# Patient Record
Sex: Male | Born: 1970 | Race: Black or African American | Hispanic: No | Marital: Married | Smoking: Never smoker
Health system: Southern US, Community
[De-identification: ages and names within clinical notes are randomized; demographics above are authoritative.]

## PROBLEM LIST (undated history)

## (undated) DIAGNOSIS — E785 Hyperlipidemia, unspecified: Secondary | ICD-10-CM

## (undated) DIAGNOSIS — I10 Essential (primary) hypertension: Secondary | ICD-10-CM

---

## 2008-03-12 ENCOUNTER — Emergency Department (HOSPITAL_COMMUNITY): Admission: EM | Admit: 2008-03-12 | Discharge: 2008-03-12 | Payer: Self-pay | Admitting: Emergency Medicine

## 2012-06-07 ENCOUNTER — Emergency Department (INDEPENDENT_AMBULATORY_CARE_PROVIDER_SITE_OTHER): Payer: PRIVATE HEALTH INSURANCE

## 2012-06-07 ENCOUNTER — Emergency Department
Admission: EM | Admit: 2012-06-07 | Discharge: 2012-06-07 | Disposition: A | Discharge status: Home / Self Care | Payer: PRIVATE HEALTH INSURANCE | Source: Home / Self Care | Attending: Family Medicine | Admitting: Family Medicine

## 2012-06-07 DIAGNOSIS — J069 Acute upper respiratory infection, unspecified: Secondary | ICD-10-CM

## 2012-06-07 DIAGNOSIS — J40 Bronchitis, not specified as acute or chronic: Secondary | ICD-10-CM

## 2012-06-07 DIAGNOSIS — R05 Cough: Secondary | ICD-10-CM

## 2012-06-07 MED ORDER — AZITHROMYCIN 250 MG PO TABS: ORAL_TABLET | ORAL | Status: DC

## 2012-06-07 MED ORDER — HYDROCOD POLST-CHLORPHEN POLST 10-8 MG/5ML PO LQCR: 5.0000 mL | Freq: Two times a day (BID) | ORAL | Status: DC | PRN

## 2012-06-07 NOTE — ED Notes (Signed)
States sinus pressure and cough started three days ago.  W/out relief from OTC meds

## 2012-06-07 NOTE — ED Provider Notes (Signed)
History     CSN: [id number]  Arrival date & time 06/07/12  1327   First MD Initiated Contact with Patient 06/07/12 1333      Chief Complaint  Patient presents with  . Nasal Congestion  . Facial Pain   HPI  URI Symptoms Onset: 2 weeks Description: cough, rhinorrhea, sinus pressure  Modifying factors:  Hx/o asthma. Has has no wheezing or shortness of breath. No wheezing.    Symptoms Nasal discharge: yes Fever: no Sore throat: mild Cough: yes Wheezing: no Ear pain: no GI symptoms: no Sick contacts: yes  Red Flags  Stiff neck: no Dyspnea: no Rash: no Swallowing difficulty: no  Sinusitis Risk Factors Headache/face pain: no Double sickening: no tooth pain: no  Allergy Risk Factors Sneezing: no Itchy scratchy throat: no Seasonal symptoms: no  Flu Risk Factors Headache: no muscle aches: no severe fatigue: no   No past medical history on file.  No past surgical history on file.  No family history on file.  History  Substance Use Topics  . Smoking status: Not on file  . Smokeless tobacco: Not on file  . Alcohol Use: Not on file      Review of Systems  All other systems reviewed and are negative.    Allergies  Review of patient's allergies indicates no known allergies.  Home Medications   Current Outpatient Rx  Name  Route  Sig  Dispense  Refill  . AZITHROMYCIN 250 MG PO TABS      Take 2 tabs PO x 1 dose, then 1 tab PO QD x 4 days   6 tablet   0   . HYDROCOD POLST-CPM POLST ER 10-8 MG/5ML PO LQCR   Oral   Take 5 mLs by mouth every 12 (twelve) hours as needed (cough).   60 mL   0     BP 123/83  Pulse 98  Temp 98.7 F (37.1 C)  Resp 18  Ht 5\' 10"  (1.778 m)  Wt 225 lb (102.059 kg)  BMI 32.28 kg/m2  SpO2 95%  Physical Exam  Constitutional: He appears well-developed and well-nourished.  HENT:  Head: Normocephalic and atraumatic.  Right Ear: External ear normal.  Left Ear: External ear normal.       +nasal erythema,  rhinorrhea bilaterally, + post oropharyngeal erythema    Eyes: Conjunctivae normal are normal. Pupils are equal, round, and reactive to light.  Neck: Normal range of motion. Neck supple.  Cardiovascular: Normal rate, regular rhythm and normal heart sounds.   Pulmonary/Chest: Effort normal and breath sounds normal. He has no wheezes.  Abdominal: Soft.  Musculoskeletal: Normal range of motion.  Neurological: He is alert.  Skin: Skin is warm.    ED Course  Procedures (including critical care time)  Labs Reviewed - No data to display Dg Chest 2 View  06/07/2012  *RADIOLOGY REPORT*  Clinical Data: Cough  CHEST - 2 VIEW  Comparison: None.  Findings: Normal heart size and vascularity.  Slight peribronchial cuffing centrally and interstitial prominence can be seen with bronchitis.  No definite focal pneumonia, collapse, consolidation, edema, effusion or pneumothorax.  Trachea is midline.  No osseous abnormality.  IMPRESSION: Bronchitic changes as described.  No focal pneumonia.   Original Report Authenticated By: Judie Petit. Shick, M.D.      1. URI (upper respiratory infection)   2. Bronchitis       MDM  Will place on zpak for bronchitic coverage given duration of sxs.  Tussionex for cough.  Discussed resp  and infectious red flags.  Follow up as needed.     The patient and/or caregiver has been counseled thoroughly with regard to treatment plan and/or medications prescribed including dosage, schedule, interactions, rationale for use, and possible side effects and they verbalize understanding. Diagnoses and expected course of recovery discussed and will return if not improved as expected or if the condition worsens. Patient and/or caregiver verbalized understanding.             Doree Albee, MD 06/07/12 1451

## 2012-06-15 ENCOUNTER — Telehealth: Payer: Self-pay | Admitting: *Deleted

## 2013-06-22 IMAGING — CR DG CHEST 2V
2 series · 2 of 2 positions shown · non-contrast
Comparison: None.

CLINICAL DATA: Cough

CHEST - 2 VIEW

[view not recorded (1 of 2)]
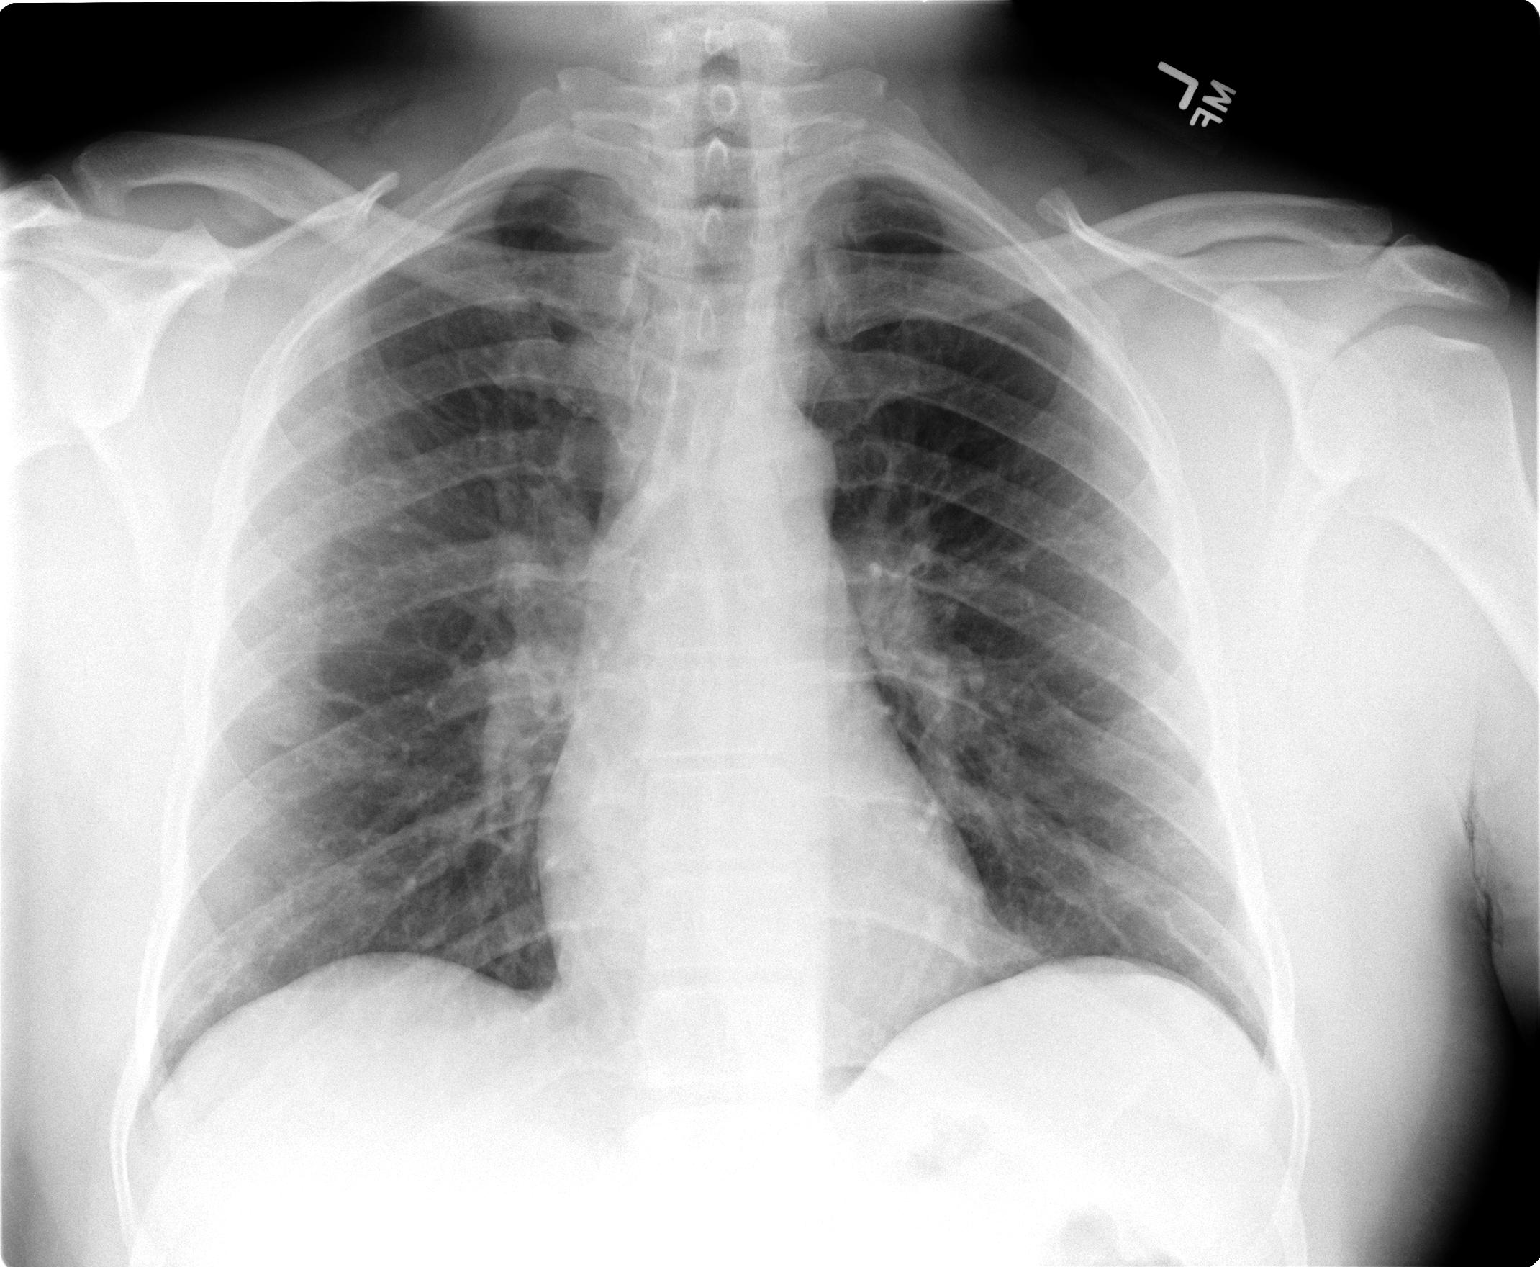

[view not recorded (2 of 2)]
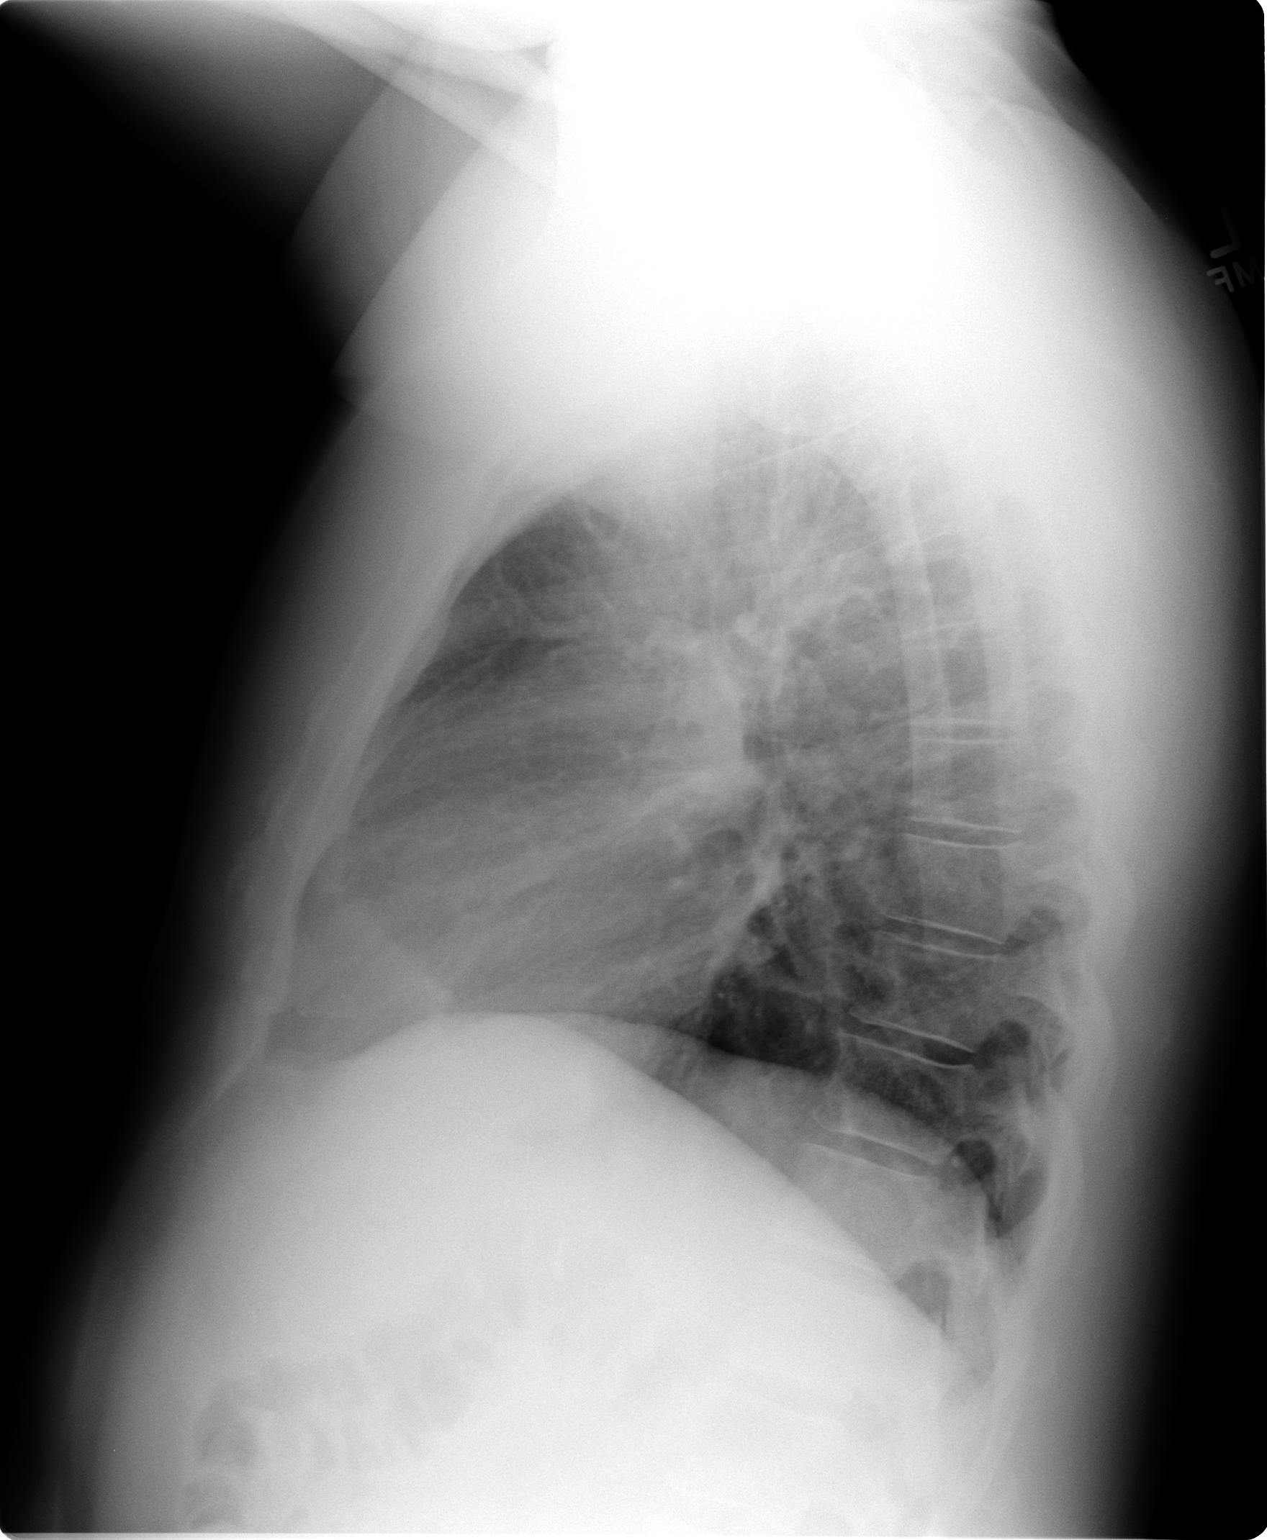

[2 of 2 positions shown; findings below may reference images not displayed]

FINDINGS: Normal heart size and vascularity.  Slight peribronchial
cuffing centrally and interstitial prominence can be seen with
bronchitis.  No definite focal pneumonia, collapse, consolidation,
edema, effusion or pneumothorax.  Trachea is midline.  No osseous
abnormality.
IMPRESSION: Bronchitic changes as described.  No focal pneumonia.

## 2013-09-24 ENCOUNTER — Emergency Department
Admission: EM | Admit: 2013-09-24 | Discharge: 2013-09-24 | Disposition: A | Discharge status: Home / Self Care | Payer: Managed Care, Other (non HMO) | Source: Home / Self Care | Attending: Family Medicine | Admitting: Family Medicine

## 2013-09-24 ENCOUNTER — Encounter: Payer: Self-pay | Admitting: Emergency Medicine

## 2013-09-24 DIAGNOSIS — J069 Acute upper respiratory infection, unspecified: Secondary | ICD-10-CM

## 2013-09-24 MED ORDER — AZITHROMYCIN 250 MG PO TABS: ORAL_TABLET | ORAL | Status: DC

## 2013-09-24 MED ORDER — PREDNISONE 20 MG PO TABS: 20.0000 mg | ORAL_TABLET | Freq: Two times a day (BID) | ORAL | Status: AC

## 2013-09-24 NOTE — Discharge Instructions (Signed)
Take plain Mucinex (1200 mg guaifenesin) twice daily for cough and congestion.  May add Sudafed for sinus congestion.   Increase fluid intake, rest. May use Afrin nasal spray (or generic oxymetazoline) twice daily for about 5 days.  Also recommend using saline nasal spray several times daily and saline nasal irrigation (AYR is a common brand) Stop all antihistamines for now, and other non-prescription cough/cold preparations. May take Delsym Cough Suppressant at bedtime for nighttime cough.  Begin Azithromycin if not improving about 5 days or if persistent fever develops   Follow-up with family doctor if not improving 7 to 10 days.

## 2013-09-24 NOTE — ED Provider Notes (Signed)
CSN: [id number]     Arrival date & time 09/24/13  1628 History   First MD Initiated Contact with Patient 09/24/13 1653     Chief Complaint  Patient presents with  . Sinus Problem      HPI Comments: Patient developed increased nasal congestion about nine days ago followed by a mild sore throat and fatigue.  He next developed a mild cough.  No fevers, chills, and sweats.  His cough has almost resolved but he has persistent sinus congestion. He has a history of seasonal allergies.  The history is provided by the patient.    History reviewed. No pertinent past medical history. History reviewed. No pertinent past surgical history. Family History  Problem Relation Age of Onset  . Hypertension Mother   . Diabetes Father   . Heart disease Father   . Hypertension Sister    History  Substance Use Topics  . Smoking status: Never Smoker   . Smokeless tobacco: Never Used  . Alcohol Use: No    Review of Systems + sore throat + cough No pleuritic pain No wheezing + nasal congestion + post-nasal drainage No sinus pain/pressure No itchy/red eyes ? earache No hemoptysis No SOB No fever, + chills No nausea No vomiting No abdominal pain No diarrhea No urinary symptoms No skin rash + fatigue No myalgias + headache Used OTC meds without relief  Allergies  Review of patient's allergies indicates no known allergies.  Home Medications   Prior to Admission medications   Not on File   BP 137/98  Pulse 93  Temp(Src) 98 F (36.7 C) (Oral)  Resp 14  Wt 225 lb (102.059 kg)  SpO2 97% Physical Exam + sore throat + cough No pleuritic pain No wheezing + nasal congestion + post-nasal drainage + sinus pain/pressure No itchy/red eyes No earache No hemoptysis No SOB No fever/chills No nausea No vomiting No abdominal pain No diarrhea No urinary symptoms No skin rash + fatigue No myalgias + headache Used OTC meds without relief   ED Course  Procedures  none       MDM   1. Viral URI     There is no evidence of bacterial infection today.   Begin prednisone burst. Take plain Mucinex (1200 mg guaifenesin) twice daily for cough and congestion.  May add Sudafed for sinus congestion.   Increase fluid intake, rest. May use Afrin nasal spray (or generic oxymetazoline) twice daily for about 5 days.  Also recommend using saline nasal spray several times daily and saline nasal irrigation (AYR is a common brand) Stop all antihistamines for now, and other non-prescription cough/cold preparations. May take Delsym Cough Suppressant at bedtime for nighttime cough.  Begin Azithromycin if not improving about 5 days or if persistent fever develops (Given a prescription to hold, with an expiration date)  Follow-up with family doctor if not improving 7 to 10 days.     Lattie HawStephen A Beese, MD 09/24/13 2005

## 2013-09-24 NOTE — ED Notes (Signed)
Sean Ochoa c/o sinus pain, congestion and cough with yellow mucous x 1 week. Denies fever. Taken Generic cold/flu and decongestant otc.

## 2013-10-20 ENCOUNTER — Telehealth: Payer: Self-pay | Admitting: *Deleted

## 2013-11-09 ENCOUNTER — Emergency Department
Admission: EM | Admit: 2013-11-09 | Discharge: 2013-11-09 | Disposition: A | Discharge status: Home / Self Care | Payer: Managed Care, Other (non HMO) | Source: Home / Self Care | Attending: Emergency Medicine | Admitting: Emergency Medicine

## 2013-11-09 ENCOUNTER — Encounter: Payer: Self-pay | Admitting: Emergency Medicine

## 2013-11-09 DIAGNOSIS — J209 Acute bronchitis, unspecified: Secondary | ICD-10-CM

## 2013-11-09 HISTORY — DX: Essential (primary) hypertension: I10

## 2013-11-09 HISTORY — DX: Hyperlipidemia, unspecified: E78.5

## 2013-11-09 MED ORDER — METHYLPREDNISOLONE SODIUM SUCC 125 MG IJ SOLR
125.0000 mg | INTRAMUSCULAR | Status: AC
  Administered 2013-11-09: 125 mg via INTRAMUSCULAR

## 2013-11-09 MED ORDER — IPRATROPIUM-ALBUTEROL 0.5-2.5 (3) MG/3ML IN SOLN
3.0000 mL | Freq: Once | RESPIRATORY_TRACT | Status: AC
  Administered 2013-11-09: 3 mL via RESPIRATORY_TRACT

## 2013-11-09 MED ORDER — LEVOFLOXACIN 500 MG PO TABS: ORAL_TABLET | ORAL | Status: AC

## 2013-11-09 MED ORDER — PREDNISONE 20 MG PO TABS: 20.0000 mg | ORAL_TABLET | Freq: Two times a day (BID) | ORAL | Status: AC

## 2013-11-09 NOTE — ED Notes (Signed)
Pt c/o nasal congestion, yellow nasal d/c, and chest congestion x 6 wks.

## 2013-11-09 NOTE — ED Provider Notes (Signed)
CSN: [id number]     Arrival date & time 11/09/13  1805 History   First MD Initiated Contact with Patient 11/09/13 1821     Chief Complaint  Patient presents with  . Nasal Congestion   (Consider location/radiation/quality/duration/timing/severity/associated sxs/prior Treatment) HPI URI HISTORY  Sean Ochoa is a 43 y.o. male who complains of cough and cold and chest congestion symptoms for the past month and a half. Has been treated with various OTC meds, then Mucinex, then a Z-Pak which helps somewhat last month but symptoms persisted intermittently, now much worse the past week.  No chills/sweats Minimal low-grade Fever  +  Nasal congestion +  Discolored Post-nasal drainage Positive sinus pain/pressure No sore throat  +  Cough, occasional, slightly discolored sputum Positive wheezing Positive chest congestion No hemoptysis No shortness of breath No pleuritic pain  No itchy/red eyes No earache  No nausea No vomiting No abdominal pain No diarrhea  No skin rashes +  Fatigue No myalgias No headache   Past Medical History  Diagnosis Date  . Hyperlipidemia   . Hypertension    History reviewed. No pertinent past surgical history. Family History  Problem Relation Age of Onset  . Hypertension Mother   . Diabetes Father   . Heart disease Father   . Hypertension Sister    History  Substance Use Topics  . Smoking status: Never Smoker   . Smokeless tobacco: Never Used  . Alcohol Use: No    Review of Systems  All other systems reviewed and are negative.   Allergies  Review of patient's allergies indicates no known allergies.  Home Medications   Prior to Admission medications   Medication Sig Start Date End Date Taking? Authorizing Provider  albuterol (PROVENTIL HFA;VENTOLIN HFA) 108 (90 BASE) MCG/ACT inhaler Inhale into the lungs every 6 (six) hours as needed for wheezing or shortness of breath.   Yes Historical Provider, MD  Chlorpheniramine Maleate (EQ  CHLORTABS PO) Take by mouth.   Yes Historical Provider, MD  guaiFENesin (MUCINEX) 600 MG 12 hr tablet Take by mouth 2 (two) times daily.   Yes Historical Provider, MD  phenylephrine (SUDAFED PE) 10 MG TABS tablet Take 10 mg by mouth every 4 (four) hours as needed.   Yes Historical Provider, MD  levofloxacin (LEVAQUIN) 500 MG tablet Take 1 tablet daily X 10 days. 11/09/13   Lajean Manesavid Massey, MD  predniSONE (DELTASONE) 20 MG tablet Take 1 tablet (20 mg total) by mouth 2 (two) times daily. Take with food. 09/24/13   Lattie HawStephen A Beese, MD  predniSONE (DELTASONE) 20 MG tablet Take 1 tablet (20 mg total) by mouth 2 (two) times daily with a meal. X 5 days 11/09/13   Lajean Manesavid Massey, MD   BP 129/85  Pulse 75  Temp(Src) 98 F (36.7 C) (Oral)  Resp 18  Ht 5\' 10"  (1.778 m)  Wt 223 lb (101.152 kg)  BMI 32.00 kg/m2  SpO2 97% Physical Exam  Nursing note and vitals reviewed. Constitutional: He is oriented to person, place, and time. He appears well-developed and well-nourished. No distress.  HENT:  Head: Normocephalic and atraumatic.  Right Ear: Tympanic membrane, external ear and ear canal normal.  Left Ear: Tympanic membrane, external ear and ear canal normal.  Nose: Mucosal edema and rhinorrhea present. Right sinus exhibits maxillary sinus tenderness. Left sinus exhibits maxillary sinus tenderness.  Mouth/Throat: Oropharynx is clear and moist. No oral lesions. No oropharyngeal exudate.  Eyes: Right eye exhibits no discharge. Left eye exhibits no discharge. No scleral  icterus.  Neck: Neck supple.  Cardiovascular: Normal rate, regular rhythm and normal heart sounds.   Pulmonary/Chest: Effort normal. He has wheezes. He has rhonchi. He has no rales.  Lymphadenopathy:    He has no cervical adenopathy.  Neurological: He is alert and oriented to person, place, and time.  Skin: Skin is warm and dry.    ED Course  Procedures (including critical care time) Labs Review Labs Reviewed - No data to display  Imaging  Review No results found.   MDM   1. Bronchitis with bronchospasm    Treatment options discussed, as well as risks, benefits, alternatives. Patient voiced understanding and agreement with the following plans: DuoNeb nebulizer treatment given. Wheezing improved. Solu-Medrol 125 mg IM Prednisone burst Levaquin 500 mg daily x10 days  Follow-up with your primary care doctor in 5-7 days if not improving, or sooner if symptoms become worse. Precautions discussed. Red flags discussed. Questions invited and answered. Patient voiced understanding and agreement.  Over 25 minutes spent, greater than 50% of the time spent for counseling and coordination of care.   Lajean Manesavid Massey, MD 11/09/13 2012
